# Patient Record
Sex: Male | Born: 1970 | Race: Black or African American | Hispanic: No | Marital: Married | Smoking: Never smoker
Health system: Southern US, Community
[De-identification: ages and names within clinical notes are randomized; demographics above are authoritative.]

## PROBLEM LIST (undated history)

## (undated) DIAGNOSIS — I10 Essential (primary) hypertension: Secondary | ICD-10-CM

---

## 2016-04-29 ENCOUNTER — Encounter (HOSPITAL_BASED_OUTPATIENT_CLINIC_OR_DEPARTMENT_OTHER): Payer: Self-pay

## 2016-04-29 ENCOUNTER — Emergency Department (HOSPITAL_BASED_OUTPATIENT_CLINIC_OR_DEPARTMENT_OTHER)
Admission: EM | Admit: 2016-04-29 | Discharge: 2016-04-29 | Disposition: A | Discharge status: Home / Self Care | Payer: BLUE CROSS/BLUE SHIELD | Attending: Dermatology | Admitting: Dermatology

## 2016-04-29 ENCOUNTER — Emergency Department (HOSPITAL_BASED_OUTPATIENT_CLINIC_OR_DEPARTMENT_OTHER): Payer: BLUE CROSS/BLUE SHIELD

## 2016-04-29 DIAGNOSIS — Y999 Unspecified external cause status: Secondary | ICD-10-CM | POA: Insufficient documentation

## 2016-04-29 DIAGNOSIS — I10 Essential (primary) hypertension: Secondary | ICD-10-CM | POA: Insufficient documentation

## 2016-04-29 DIAGNOSIS — Y939 Activity, unspecified: Secondary | ICD-10-CM | POA: Insufficient documentation

## 2016-04-29 DIAGNOSIS — Y929 Unspecified place or not applicable: Secondary | ICD-10-CM | POA: Insufficient documentation

## 2016-04-29 DIAGNOSIS — Z5321 Procedure and treatment not carried out due to patient leaving prior to being seen by health care provider: Secondary | ICD-10-CM | POA: Diagnosis not present

## 2016-04-29 DIAGNOSIS — W2203XA Walked into furniture, initial encounter: Secondary | ICD-10-CM | POA: Diagnosis not present

## 2016-04-29 DIAGNOSIS — Z79899 Other long term (current) drug therapy: Secondary | ICD-10-CM | POA: Insufficient documentation

## 2016-04-29 DIAGNOSIS — S6991XA Unspecified injury of right wrist, hand and finger(s), initial encounter: Secondary | ICD-10-CM | POA: Insufficient documentation

## 2016-04-29 HISTORY — DX: Essential (primary) hypertension: I10

## 2016-04-29 NOTE — ED Triage Notes (Signed)
Injured right middle finger this am on truck bed handle-slight lac noted at nailbed-nail is slight ly black in color-ND-steady gait

## 2016-04-29 NOTE — ED Notes (Signed)
Pt states he is leaving due to long wait.

## 2017-05-12 IMAGING — CR DG FINGER MIDDLE 2+V*R*
3 series · 3 of 3 positions shown · non-contrast
Comparison: None.

CLINICAL DATA: Smashed middle finger on trailer door.

EXAM:
RIGHT MIDDLE FINGER 2+V

[x finger pa right]
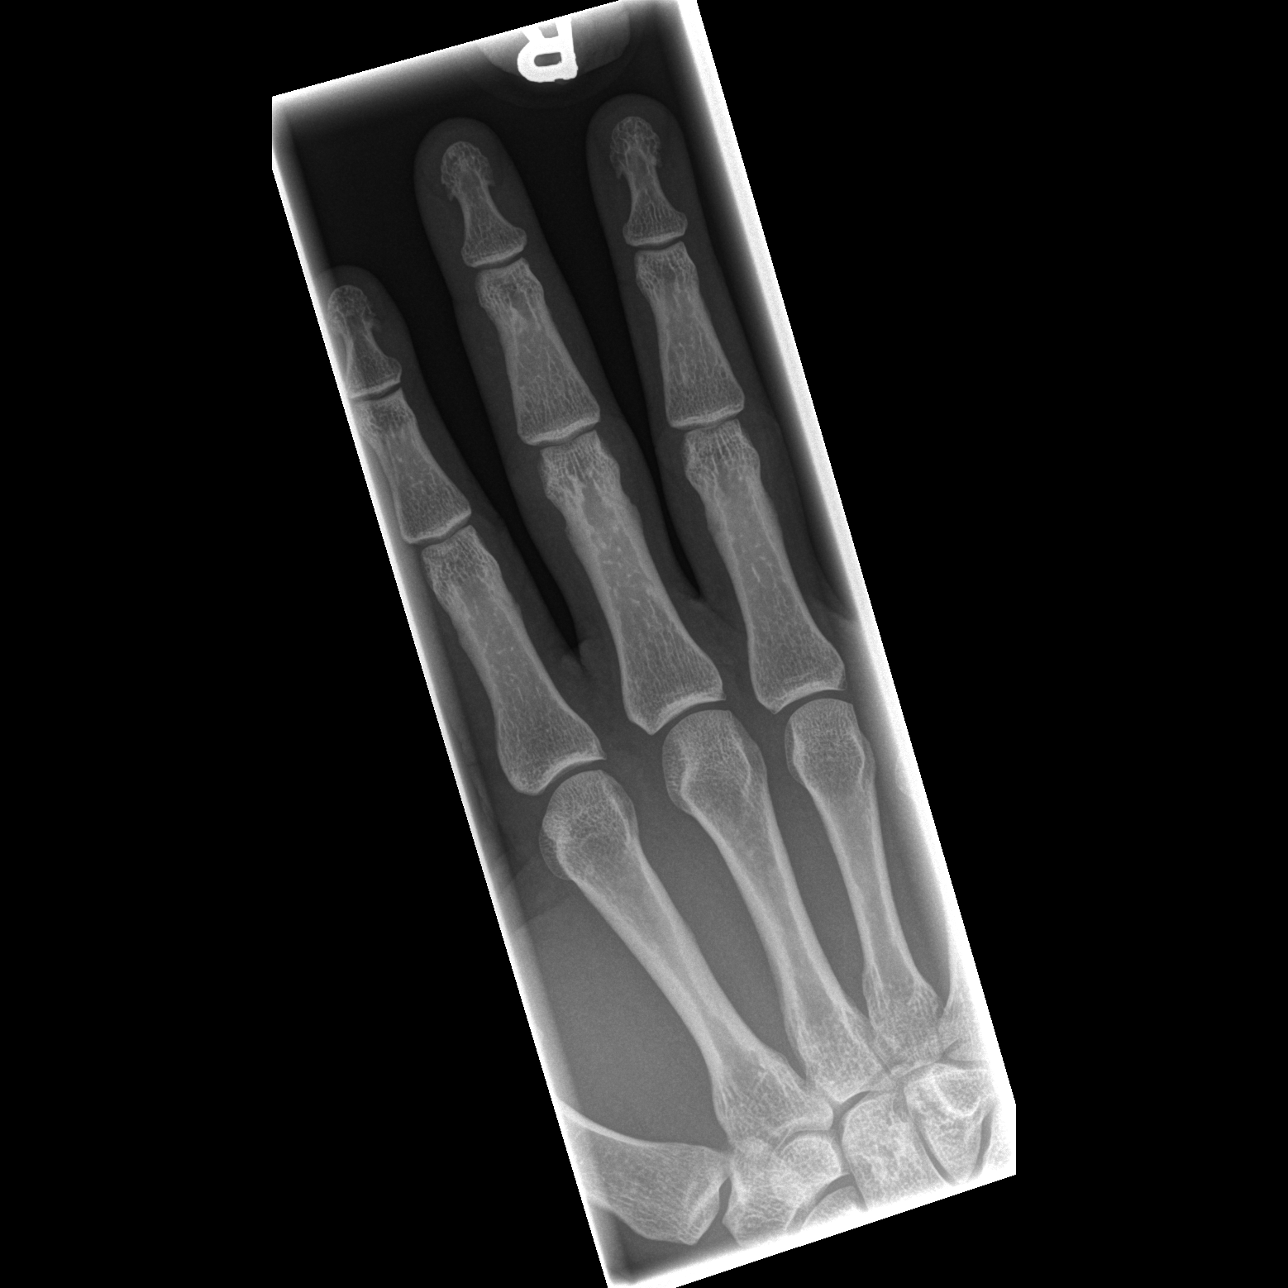

[x finger obl. right]
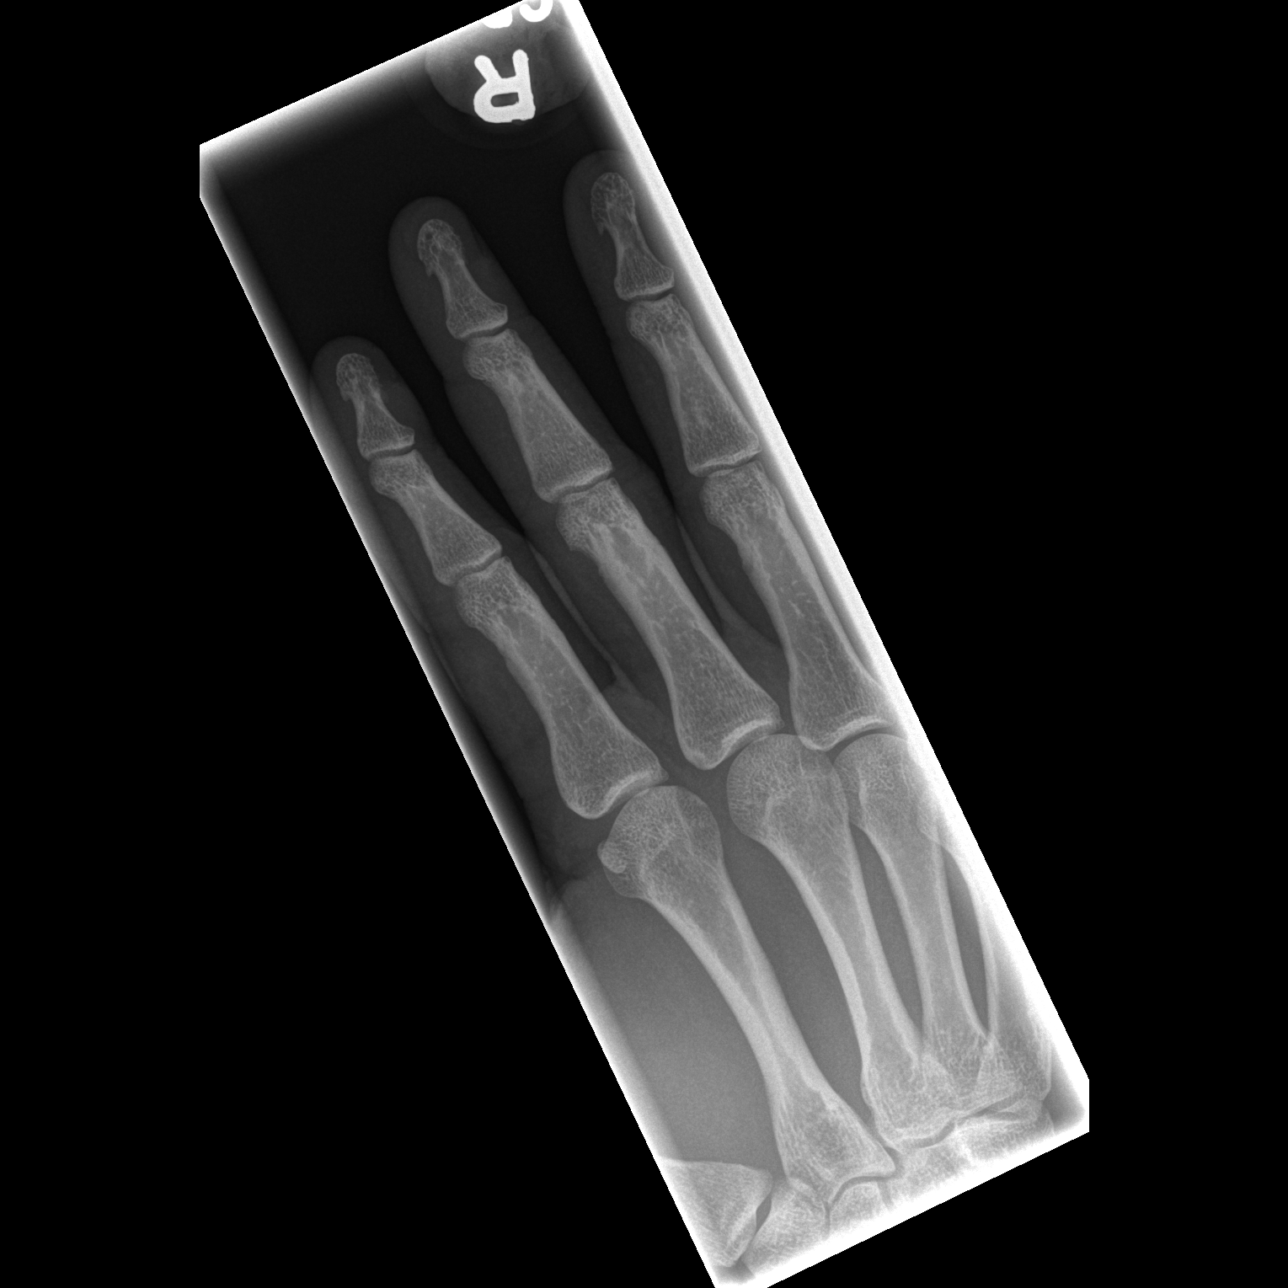

[x finger lateral right]
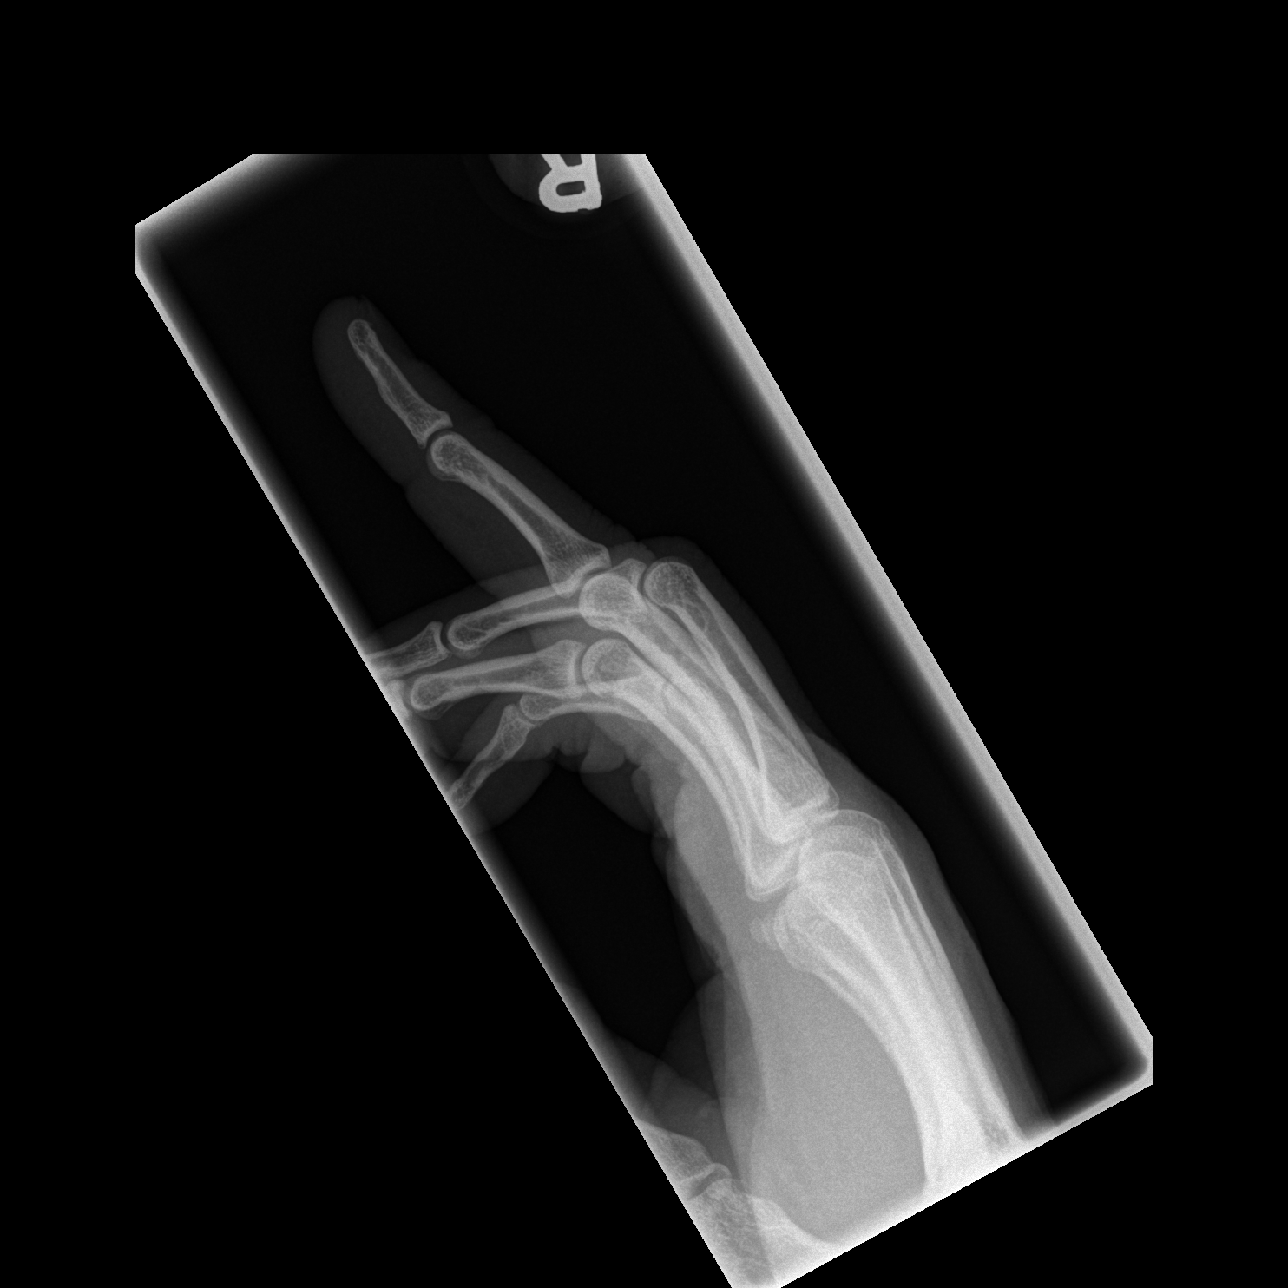

[3 of 3 positions shown; findings below may reference images not displayed]

FINDINGS: There is no evidence of fracture or dislocation. There is no
evidence of arthropathy or other focal bone abnormality. Soft
tissues are unremarkable.
IMPRESSION: Negative.
